# Patient Record
Sex: Male | Born: 1940 | Race: White | Hispanic: No | Marital: Married | Smoking: Former smoker
Health system: Southern US, Community
[De-identification: ages and names within clinical notes are randomized; demographics above are authoritative.]

## PROBLEM LIST (undated history)

## (undated) DIAGNOSIS — K5790 Diverticulosis of intestine, part unspecified, without perforation or abscess without bleeding: Secondary | ICD-10-CM

## (undated) DIAGNOSIS — Z9289 Personal history of other medical treatment: Secondary | ICD-10-CM

## (undated) DIAGNOSIS — K5900 Constipation, unspecified: Secondary | ICD-10-CM

## (undated) DIAGNOSIS — H409 Unspecified glaucoma: Secondary | ICD-10-CM

## (undated) DIAGNOSIS — J329 Chronic sinusitis, unspecified: Secondary | ICD-10-CM

## (undated) DIAGNOSIS — M720 Palmar fascial fibromatosis [Dupuytren]: Secondary | ICD-10-CM

## (undated) DIAGNOSIS — K219 Gastro-esophageal reflux disease without esophagitis: Secondary | ICD-10-CM

## (undated) DIAGNOSIS — N400 Enlarged prostate without lower urinary tract symptoms: Secondary | ICD-10-CM

## (undated) DIAGNOSIS — J310 Chronic rhinitis: Secondary | ICD-10-CM

## (undated) DIAGNOSIS — I10 Essential (primary) hypertension: Secondary | ICD-10-CM

## (undated) DIAGNOSIS — K649 Unspecified hemorrhoids: Secondary | ICD-10-CM

## (undated) DIAGNOSIS — F419 Anxiety disorder, unspecified: Secondary | ICD-10-CM

## (undated) DIAGNOSIS — Z8719 Personal history of other diseases of the digestive system: Secondary | ICD-10-CM

## (undated) HISTORY — PX: COLONOSCOPY, ESOPHAGOGASTRODUODENOSCOPY (EGD) AND ESOPHAGEAL DILATION: SHX5781

## (undated) HISTORY — PX: TONSILLECTOMY: SUR1361

---

## 2003-08-23 ENCOUNTER — Ambulatory Visit (HOSPITAL_BASED_OUTPATIENT_CLINIC_OR_DEPARTMENT_OTHER): Admission: RE | Admit: 2003-08-23 | Discharge: 2003-08-23 | Payer: Self-pay | Admitting: Orthopedic Surgery

## 2003-08-23 ENCOUNTER — Ambulatory Visit (HOSPITAL_COMMUNITY): Admission: RE | Admit: 2003-08-23 | Discharge: 2003-08-23 | Payer: Self-pay | Admitting: Orthopedic Surgery

## 2011-09-14 ENCOUNTER — Emergency Department: Payer: Self-pay | Admitting: Internal Medicine

## 2011-09-14 LAB — CK TOTAL AND CKMB (NOT AT ARMC)
CK, Total: 92 U/L (ref 35–232)
CK-MB: 0.9 ng/mL (ref 0.5–3.6)

## 2011-09-14 LAB — COMPREHENSIVE METABOLIC PANEL
Albumin: 4.1 g/dL (ref 3.4–5.0)
Alkaline Phosphatase: 62 U/L (ref 50–136)
BUN: 14 mg/dL (ref 7–18)
Bilirubin,Total: 0.8 mg/dL (ref 0.2–1.0)
Chloride: 97 mmol/L — ABNORMAL LOW (ref 98–107)
Co2: 24 mmol/L (ref 21–32)
Creatinine: 0.78 mg/dL (ref 0.60–1.30)
EGFR (Non-African Amer.): >60
Glucose: 114 mg/dL — ABNORMAL HIGH (ref 65–99)
Osmolality: 266 (ref 275–301)
SGOT(AST): 32 U/L (ref 15–37)
Sodium: 132 mmol/L — ABNORMAL LOW (ref 136–145)
Total Protein: 7.5 g/dL (ref 6.4–8.2)

## 2011-09-14 LAB — CBC
Platelet: 185 10*3/uL (ref 150–440)
RBC: 4.62 10*6/uL (ref 4.40–5.90)

## 2011-10-02 ENCOUNTER — Ambulatory Visit: Payer: Self-pay | Admitting: Urology

## 2012-02-19 IMAGING — CT CT ABDOMEN AND PELVIS WITHOUT AND WITH CONTRAST
2 of 4 series · 13 of 32 positions shown, 18 images · non-contrast
Comparison: none

REASON FOR EXAM: hematuria
COMMENTS:

[Series 2: abd 3mm wo 3.0 i40f 3 · axial · 0.90mm/px · z∈[-351,+0]mm · 8 of 151 slices shown, 13 images]
[im 17/151  soft-tissue]
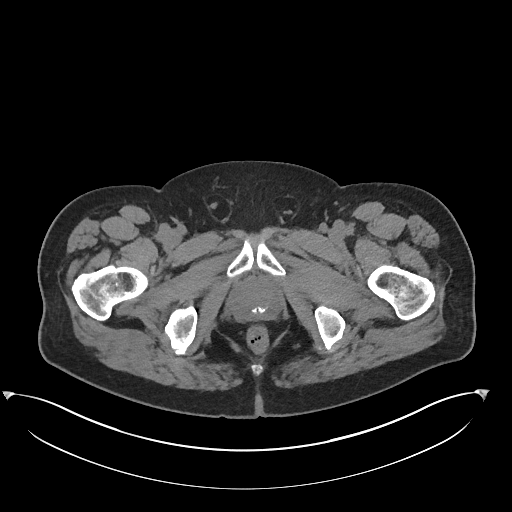
[im 17/151  bone]
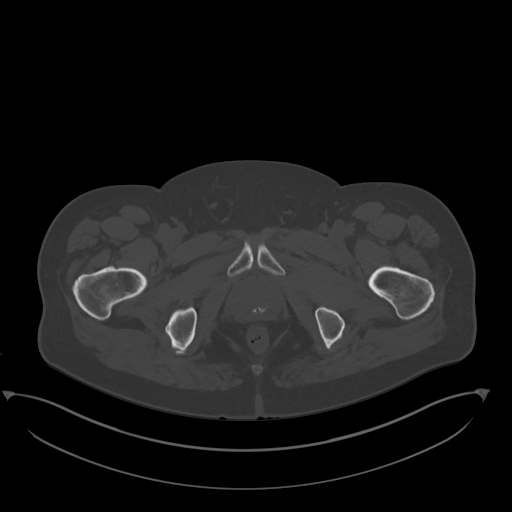
[im 34/151  soft-tissue]
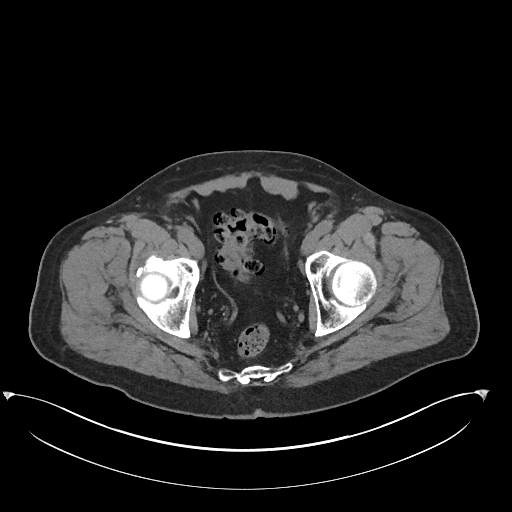
[im 51/151  soft-tissue]
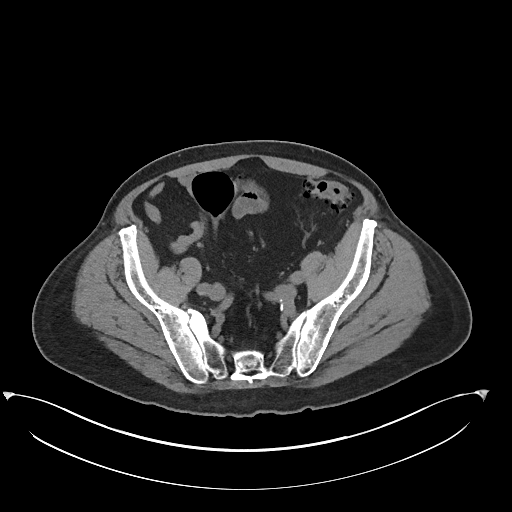
[im 67/151  soft-tissue]
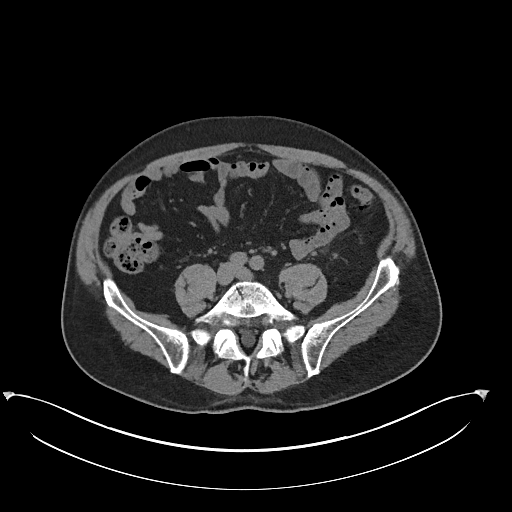
[im 84/151  soft-tissue]
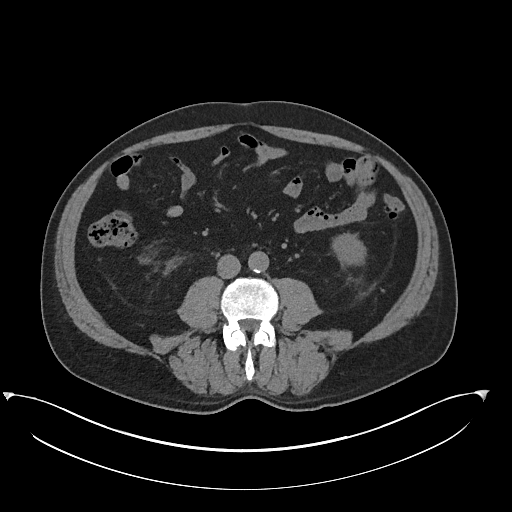
[im 84/151  lung]
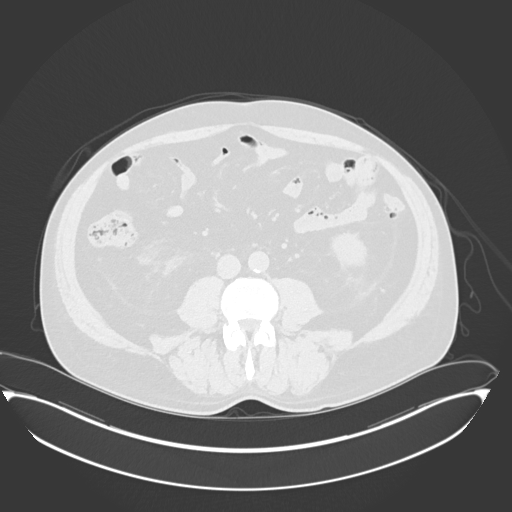
[im 101/151  soft-tissue]
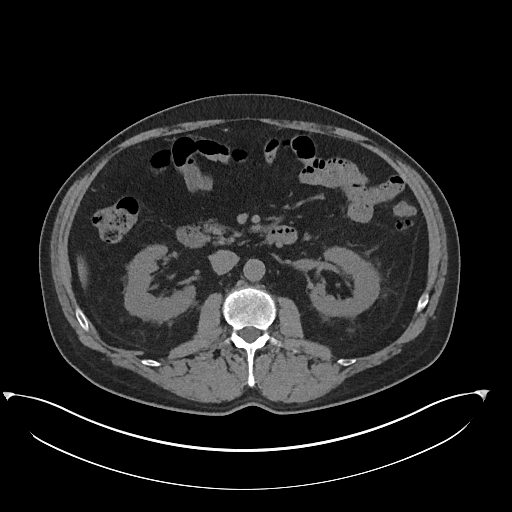
[im 101/151  lung]
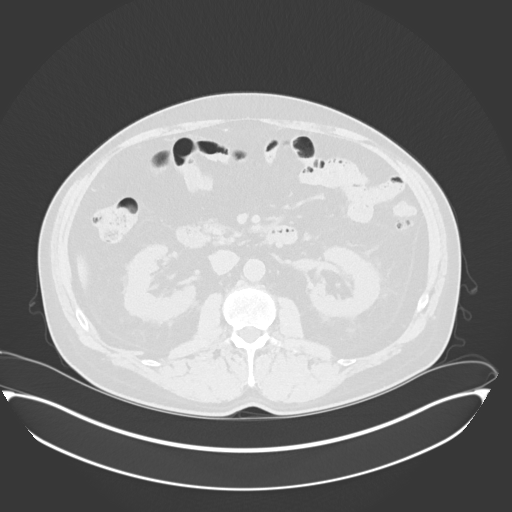
[im 117/151  soft-tissue]
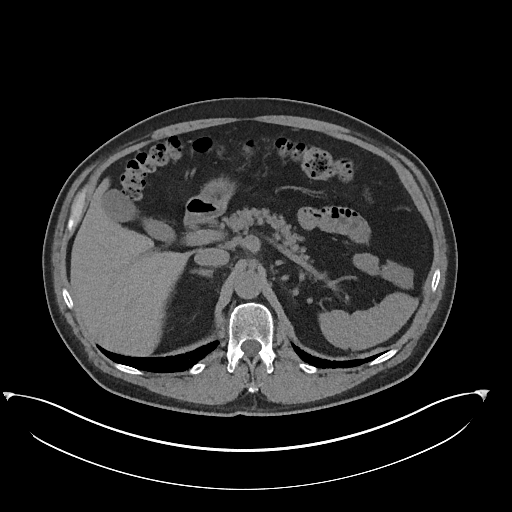
[im 117/151  lung]
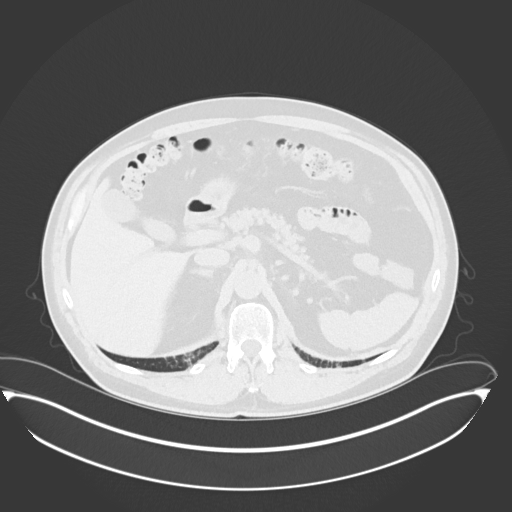
[im 134/151  soft-tissue]
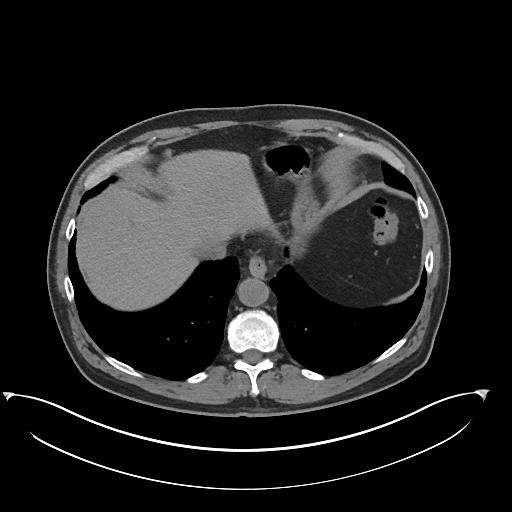
[im 134/151  lung]
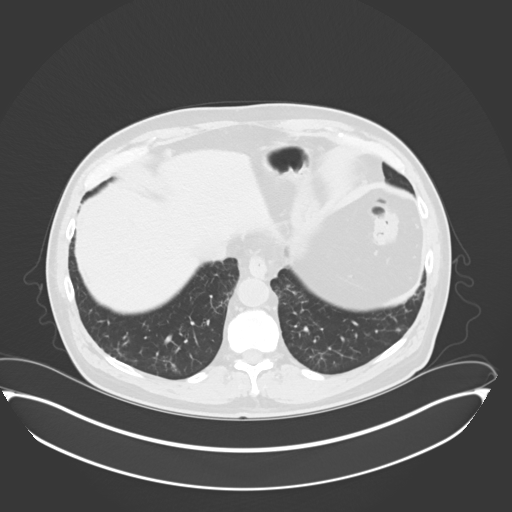

[Series 4: abd 3mm w 3.0 i40f 3 · axial · 0.90mm/px · z∈[-351,-150]mm · 5 of 151 slices shown]
[im 17/151  soft-tissue]
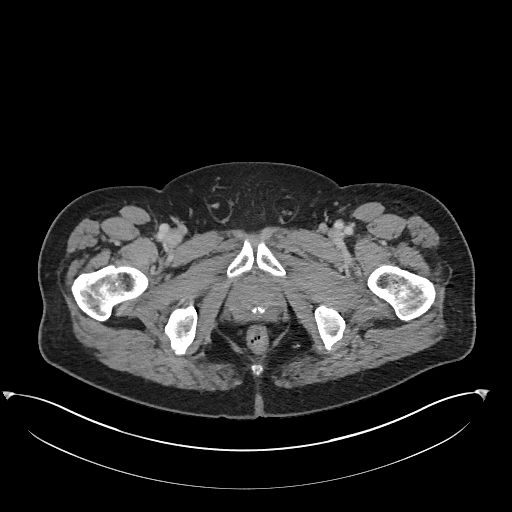
[im 34/151  soft-tissue]
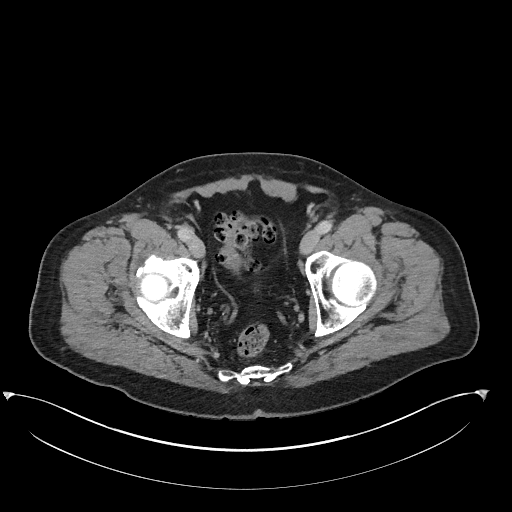
[im 51/151  soft-tissue]
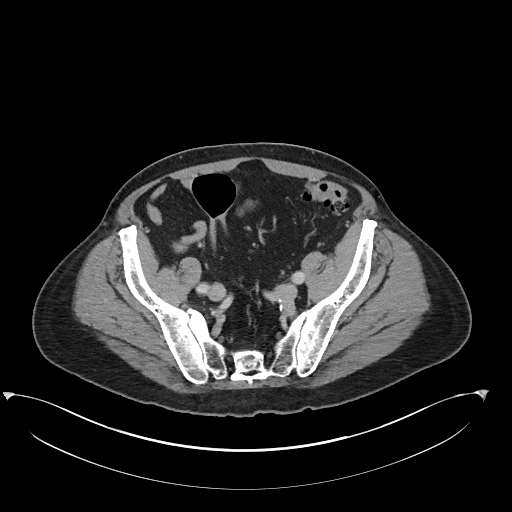
[im 67/151  soft-tissue]
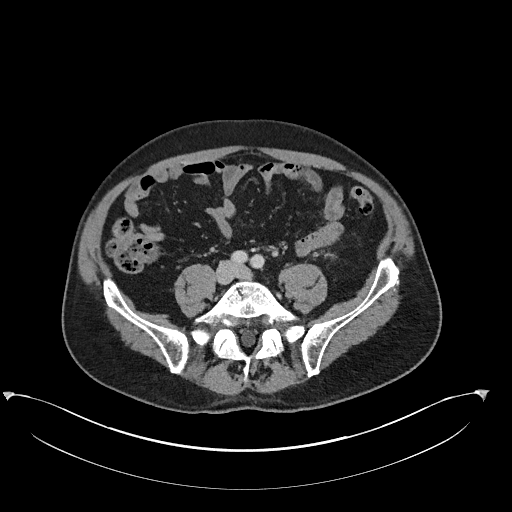
[im 84/151  soft-tissue]
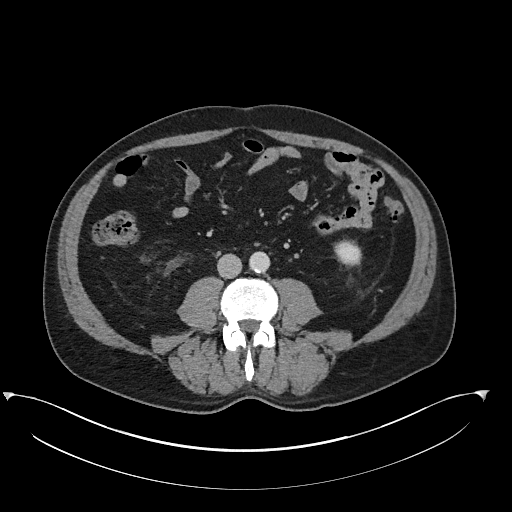

[13 of 32 positions shown; findings below may reference images not displayed]

PROCEDURE:     KCT - KCT ABDOMEN/PELVIS W/WO  - October 02, 2011 [DATE]

RESULT:     Axial CT scanning was performed through the abdomen and pelvis
with reconstructions at 3 millimeter intervals and slice thicknesses. A
triphasic technique was employed. For the postcontrast images the patient
received 100 cc of [id number].

On the noncontrast images there is increased density in the perinephric fat
bilaterally. I see no calcified stones nor obstruction nor mass is within
the parenchyma. Following contrast administration there is fairly
homogeneous enhancement of the renal parenchyma. On delayed images contrast
within the renal collecting systems appears normal. I see no renal masses.
The ureters appear normal in course and caliber. The urinary bladder is only
partially distended. There is subjective wall thickening. The prostate gland
and seminal vesicles appear normal in terms of their enhancement. The
prostate gland contains calcifications and is mildly enlarged and reduces a
mild impression upon the urinary bladder base.

There is significant sigmoid diverticulosis. I do not see objective evidence
of acute diverticulitis. There are scattered diverticula in the descending
colon is well. There is no evidence of small or large bowel obstruction. The
liver, gallbladder, pancreas, spleen, nondistended stomach, and adrenal
glands are normal in appearance. The caliber of the abdominal aorta is
normal. I see no inguinal nor umbilical hernia. The lung bases exhibit no
acute abnormality. The lumbar vertebral bodies are preserved in height. Is
degenerative disc change at L5-S1 with minimal anterolisthesis of L5 with
respect to S1. There are bilateral pars defects at L5.
IMPRESSION: 1. I do not see evidence of renal parenchymal masses nor evidence of stones
or obstruction. There is mildly increased density in the perinephric fat
bilaterally which is nonspecific. There are no enhancement abnormalities
within the renal parenchyma to suggest acute pyelonephritis.
2. There is mild enlargement of the prostate gland. The urinary bladder is
relatively nondistended and there is subjective wall thickening.
3. There is extensive sigmoid diverticulosis with milder descending colonic
diverticulosis. I do not see objective evidence of acute diverticulitis.
4. I see no acute hepatobiliary abnormality.

## 2014-01-31 ENCOUNTER — Ambulatory Visit: Payer: Self-pay

## 2014-02-22 ENCOUNTER — Ambulatory Visit: Payer: Self-pay | Admitting: Gastroenterology

## 2014-02-25 LAB — PATHOLOGY REPORT

## 2014-03-11 ENCOUNTER — Ambulatory Visit: Payer: Self-pay | Admitting: Gastroenterology

## 2014-06-20 IMAGING — US ULTRASOUND RETROPERITONEAL COMPLETE
1 series · 14 of 25 positions shown · non-contrast
Comparison: None.

CLINICAL DATA: Smoker, hypertension

EXAM:
RETROPERITONEAL ULTRASOUND COMPLETE
TECHNIQUE: Ultrasound examination of the abdominal aorta was performed to
evaluate for abdominal aortic aneurysm. The common iliac arteries,
IVC, and kidneys were also evaluated.

[Series 1: ultrasound retroperitoneal complete · 0.36mm/px · 14 of 45 slices shown]
[im 1/45]
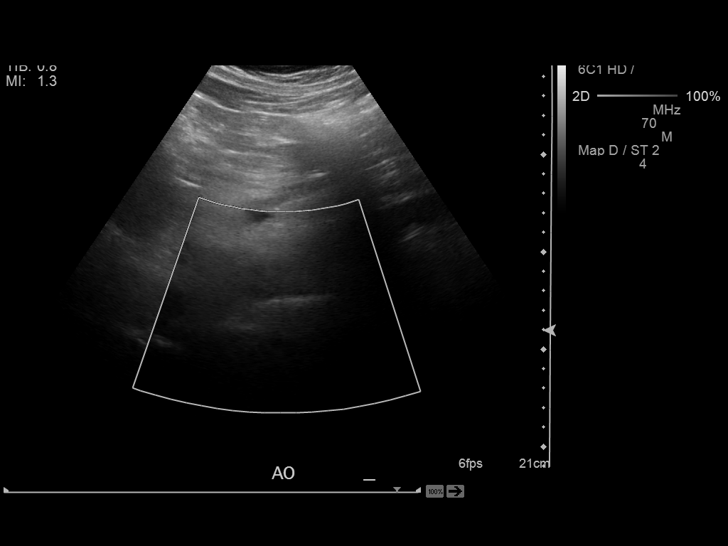
[im 4/45]
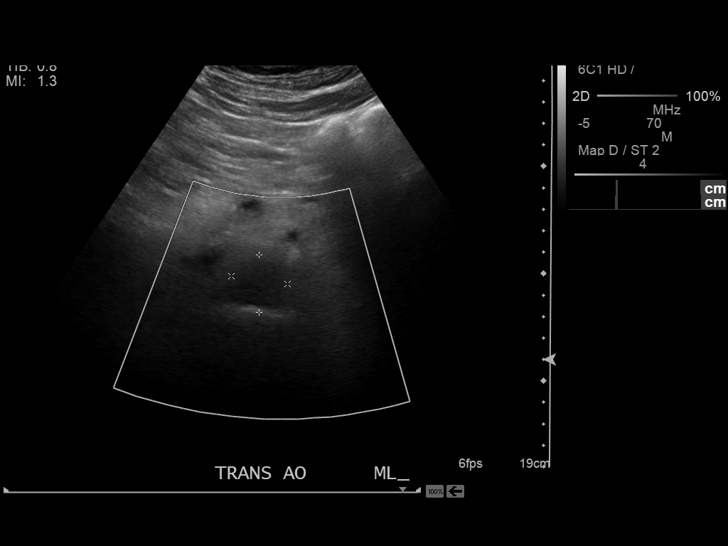
[im 8/45]
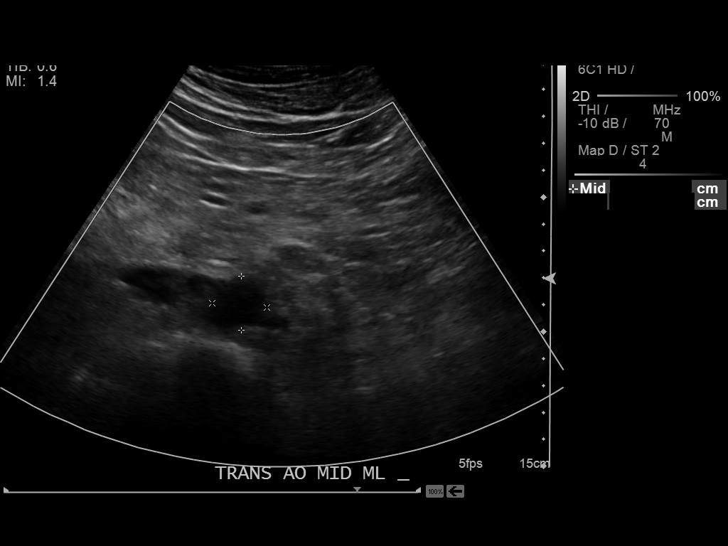
[im 12/45]
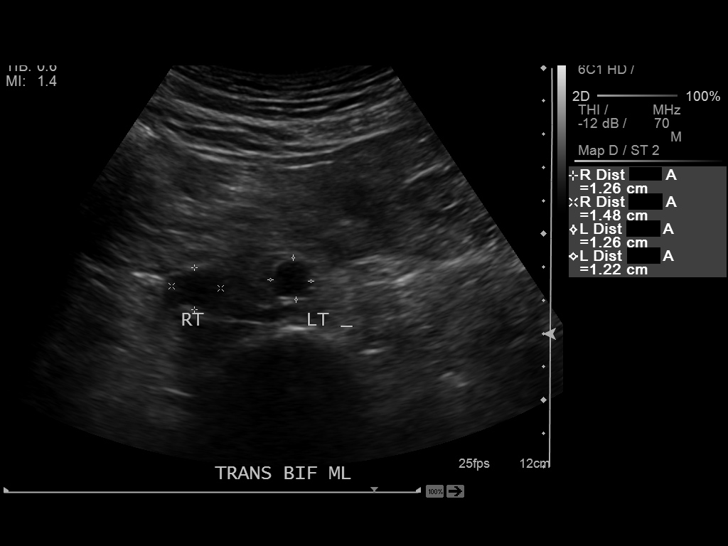
[im 15/45]
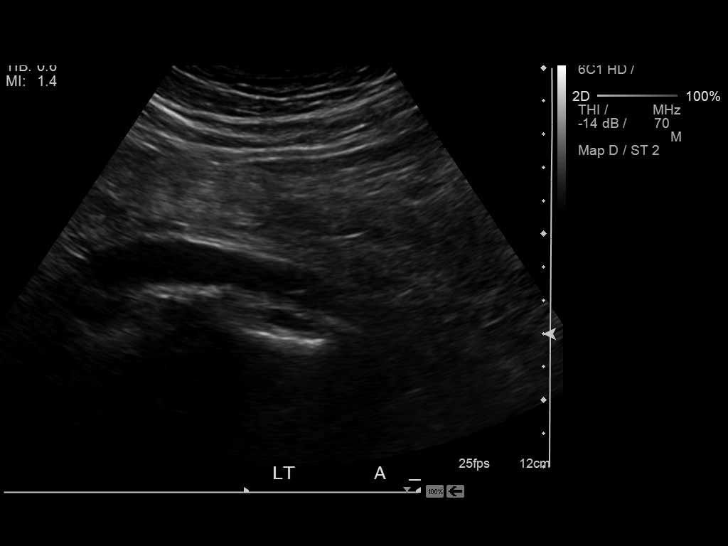
[im 17/45]
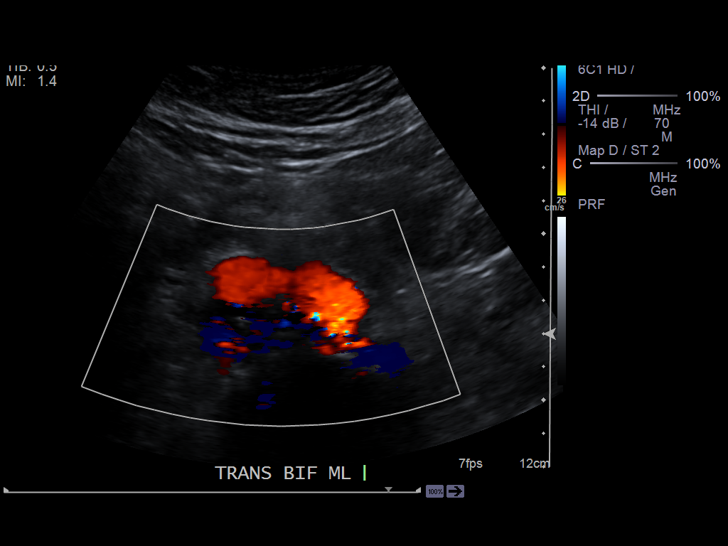
[im 21/45]
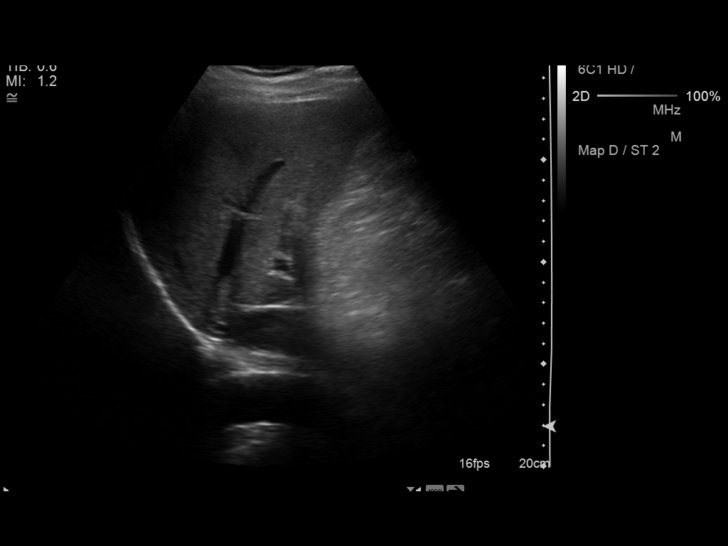
[im 24/45]
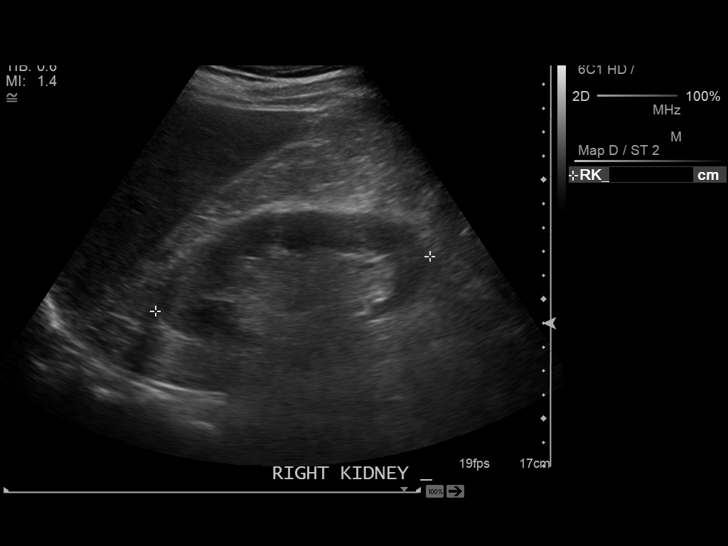
[im 28/45]
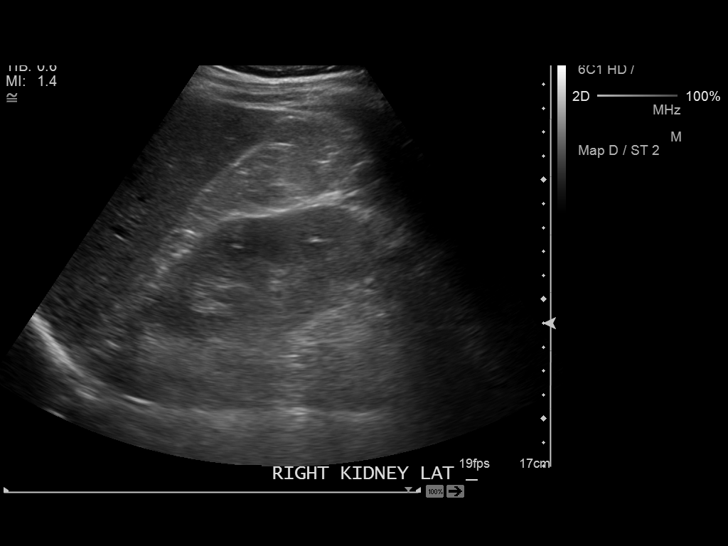
[im 30/45]
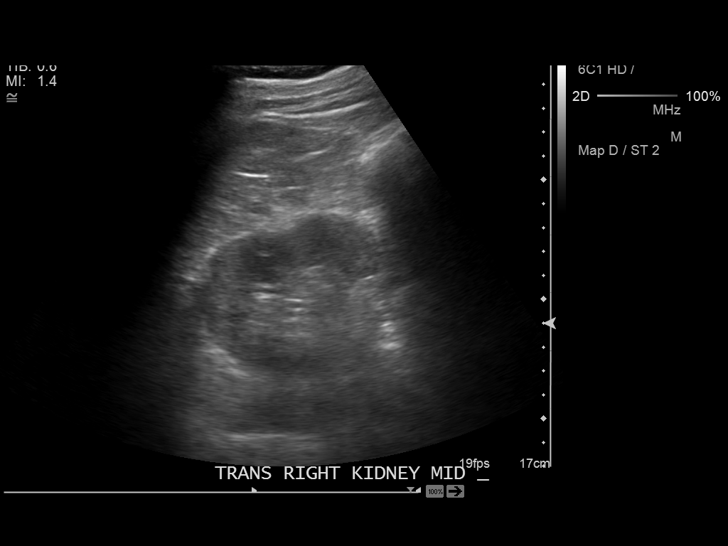
[im 34/45]
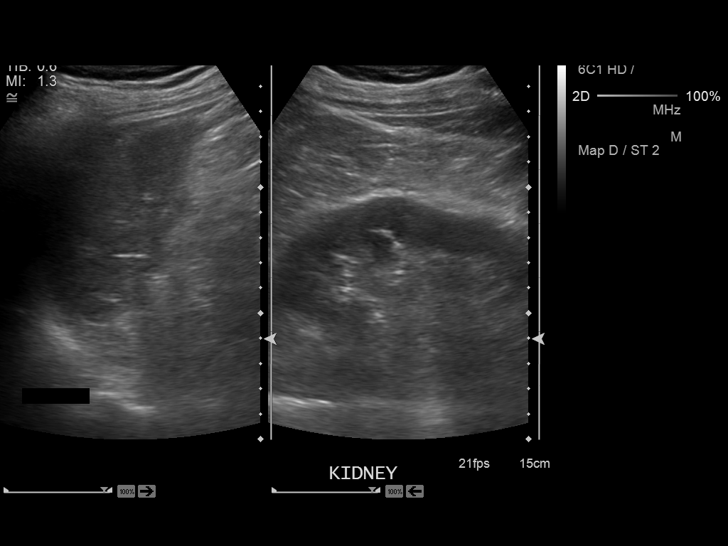
[im 37/45]
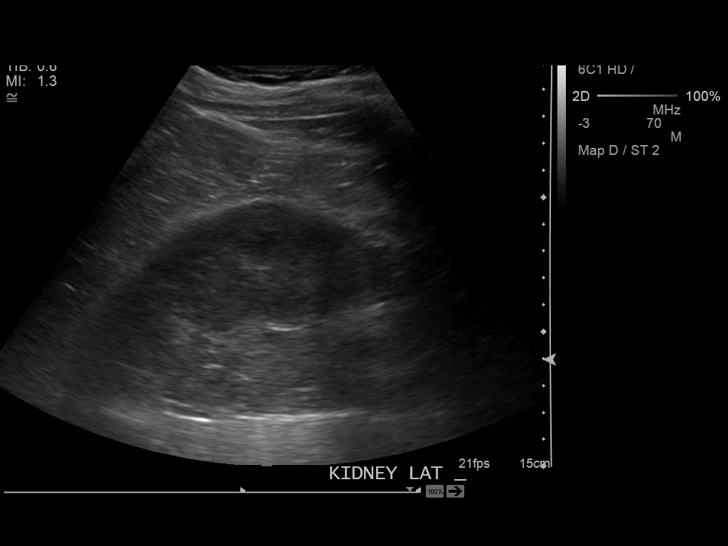
[im 41/45]
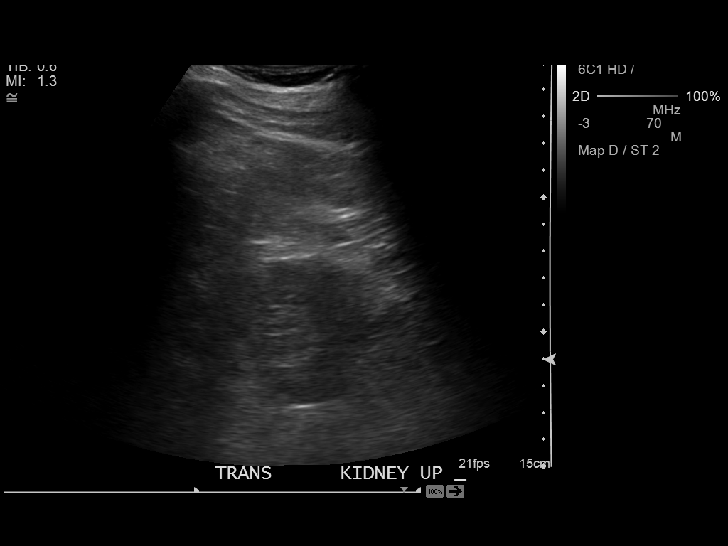
[im 45/45]
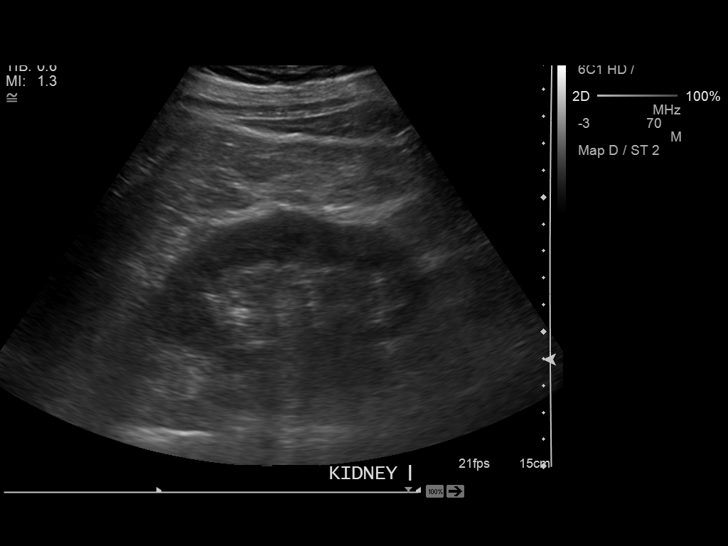

[14 of 25 positions shown; findings below may reference images not displayed]

FINDINGS: Abdominal Aorta

No aneurysm identified.

Maximum AP

Diameter:  2.8 cm

Maximum TRV

Diameter: 2.7 cm

Right Common Iliac Artery

Measures 1.5 cm in diameter.

Left Common Iliac Artery

Measures 1.3 cm in diameter.

IVC

No abnormality visualized.

Right Kidney

Length: 11.7 cm length Echogenicity within normal limits. No mass or
hydronephrosis visualized.

Left Kidney

Length: 11.7 cm Echogenicity within normal limits. No mass or
hydronephrosis visualized.
IMPRESSION: 1. No abdominal aortic aneurysm. Aorta measures 2.8 x 2.7 cm in
diameter. Ectatic bilateral common iliac artery.
2. No hydronephrosis or diagnostic renal calculus.

## 2015-04-12 ENCOUNTER — Encounter: Payer: Self-pay | Admitting: *Deleted

## 2015-04-13 ENCOUNTER — Ambulatory Visit
Admission: RE | Admit: 2015-04-13 | Discharge: 2015-04-13 | Disposition: A | Discharge status: Home / Self Care | Payer: Medicare Other | Source: Ambulatory Visit | Attending: Gastroenterology | Admitting: Gastroenterology

## 2015-04-13 ENCOUNTER — Encounter
Admission: RE | Disposition: A | Discharge status: Home / Self Care | Payer: Self-pay | Source: Ambulatory Visit | Attending: Gastroenterology

## 2015-04-13 ENCOUNTER — Encounter: Payer: Self-pay | Admitting: Anesthesiology

## 2015-04-13 ENCOUNTER — Ambulatory Visit: Payer: Medicare Other | Admitting: Anesthesiology

## 2015-04-13 DIAGNOSIS — Z79899 Other long term (current) drug therapy: Secondary | ICD-10-CM | POA: Diagnosis not present

## 2015-04-13 DIAGNOSIS — K2900 Acute gastritis without bleeding: Secondary | ICD-10-CM | POA: Diagnosis not present

## 2015-04-13 DIAGNOSIS — I10 Essential (primary) hypertension: Secondary | ICD-10-CM | POA: Insufficient documentation

## 2015-04-13 DIAGNOSIS — H409 Unspecified glaucoma: Secondary | ICD-10-CM | POA: Diagnosis not present

## 2015-04-13 DIAGNOSIS — K5790 Diverticulosis of intestine, part unspecified, without perforation or abscess without bleeding: Secondary | ICD-10-CM | POA: Diagnosis not present

## 2015-04-13 DIAGNOSIS — K449 Diaphragmatic hernia without obstruction or gangrene: Secondary | ICD-10-CM | POA: Diagnosis not present

## 2015-04-13 DIAGNOSIS — N400 Enlarged prostate without lower urinary tract symptoms: Secondary | ICD-10-CM | POA: Diagnosis not present

## 2015-04-13 DIAGNOSIS — K210 Gastro-esophageal reflux disease with esophagitis: Secondary | ICD-10-CM | POA: Insufficient documentation

## 2015-04-13 DIAGNOSIS — K5900 Constipation, unspecified: Secondary | ICD-10-CM | POA: Diagnosis not present

## 2015-04-13 DIAGNOSIS — Z7982 Long term (current) use of aspirin: Secondary | ICD-10-CM | POA: Insufficient documentation

## 2015-04-13 DIAGNOSIS — M720 Palmar fascial fibromatosis [Dupuytren]: Secondary | ICD-10-CM | POA: Insufficient documentation

## 2015-04-13 DIAGNOSIS — F419 Anxiety disorder, unspecified: Secondary | ICD-10-CM | POA: Insufficient documentation

## 2015-04-13 DIAGNOSIS — K2960 Other gastritis without bleeding: Secondary | ICD-10-CM | POA: Diagnosis present

## 2015-04-13 DIAGNOSIS — Z8719 Personal history of other diseases of the digestive system: Secondary | ICD-10-CM | POA: Diagnosis not present

## 2015-04-13 HISTORY — PX: ESOPHAGOGASTRODUODENOSCOPY (EGD) WITH PROPOFOL: SHX5813

## 2015-04-13 HISTORY — DX: Unspecified hemorrhoids: K64.9

## 2015-04-13 HISTORY — DX: Gastro-esophageal reflux disease without esophagitis: K21.9

## 2015-04-13 HISTORY — DX: Unspecified glaucoma: H40.9

## 2015-04-13 HISTORY — DX: Benign prostatic hyperplasia without lower urinary tract symptoms: N40.0

## 2015-04-13 HISTORY — DX: Essential (primary) hypertension: I10

## 2015-04-13 HISTORY — DX: Diverticulosis of intestine, part unspecified, without perforation or abscess without bleeding: K57.90

## 2015-04-13 HISTORY — DX: Constipation, unspecified: K59.00

## 2015-04-13 HISTORY — DX: Palmar fascial fibromatosis (dupuytren): M72.0

## 2015-04-13 HISTORY — DX: Anxiety disorder, unspecified: F41.9

## 2015-04-13 SURGERY — ESOPHAGOGASTRODUODENOSCOPY (EGD) WITH PROPOFOL
Anesthesia: General

## 2015-04-13 MED ORDER — GLYCOPYRROLATE 0.2 MG/ML IJ SOLN
INTRAMUSCULAR | Status: DC | PRN
  Administered 2015-04-13: 0.1 mg via INTRAVENOUS

## 2015-04-13 MED ORDER — LIDOCAINE HCL (CARDIAC) 20 MG/ML IV SOLN
INTRAVENOUS | Status: DC | PRN
  Administered 2015-04-13: 30 mg via INTRAVENOUS

## 2015-04-13 MED ORDER — SODIUM CHLORIDE 0.9 % IV SOLN: INTRAVENOUS | Status: DC

## 2015-04-13 MED ORDER — SODIUM CHLORIDE 0.9 % IV SOLN
INTRAVENOUS | Status: DC
  Administered 2015-04-13: 09:00:00 via INTRAVENOUS

## 2015-04-13 MED ORDER — PROPOFOL INFUSION 10 MG/ML OPTIME
INTRAVENOUS | Status: DC | PRN
  Administered 2015-04-13: 140 ug/kg/min via INTRAVENOUS

## 2015-04-13 MED ORDER — FENTANYL CITRATE (PF) 100 MCG/2ML IJ SOLN
INTRAMUSCULAR | Status: DC | PRN
  Administered 2015-04-13: 50 ug via INTRAVENOUS

## 2015-04-13 MED ORDER — MIDAZOLAM HCL 2 MG/2ML IJ SOLN
INTRAMUSCULAR | Status: DC | PRN
  Administered 2015-04-13: 1 mg via INTRAVENOUS

## 2015-04-13 NOTE — Anesthesia Postprocedure Evaluation (Signed)
  Anesthesia Post-op Note  Patient: Dale Holloway  Procedure(s) Performed: Procedure(s): ESOPHAGOGASTRODUODENOSCOPY (EGD) WITH PROPOFOL (N/A)  Anesthesia type:General  Patient location: PACU  Post pain: Pain level controlled  Post assessment: Post-op Vital signs reviewed, Patient's Cardiovascular Status Stable, Respiratory Function Stable, Patent Airway and No signs of Nausea or vomiting  Post vital signs: Reviewed and stable  Last Vitals:  Filed Vitals:   04/13/15 1000  BP: 144/97  Pulse: 58  Temp:   Resp: 10    Level of consciousness: awake, alert  and patient cooperative  Complications: No apparent anesthesia complications

## 2015-04-13 NOTE — Transfer of Care (Signed)
Immediate Anesthesia Transfer of Care Note  Patient: Dale Holloway  Procedure(s) Performed: Procedure(s): ESOPHAGOGASTRODUODENOSCOPY (EGD) WITH PROPOFOL (N/A)  Patient Location: PACU  Anesthesia Type:General  Level of Consciousness: awake and sedated  Airway & Oxygen Therapy: Patient Spontanous Breathing and Patient connected to nasal cannula oxygen  Post-op Assessment: Report given to RN and Post -op Vital signs reviewed and stable  Post vital signs: Reviewed and stable  Last Vitals:  Filed Vitals:   04/13/15 0824  BP: 166/96  Pulse: 65  Temp: 36.8 C  Resp: 12    Complications: No apparent anesthesia complications

## 2015-04-13 NOTE — Anesthesia Procedure Notes (Signed)
Performed by: COOK-MARTIN, Annie Roseboom Pre-anesthesia Checklist: Patient identified, Emergency Drugs available, Suction available, Patient being monitored and Timeout performed Patient Re-evaluated:Patient Re-evaluated prior to inductionOxygen Delivery Method: Nasal cannula Preoxygenation: Pre-oxygenation with 100% oxygen Intubation Type: IV induction Airway Equipment and Method: Bite block Placement Confirmation: CO2 detector and positive ETCO2     

## 2015-04-13 NOTE — Op Note (Signed)
[facility] Gastroenterology Patient Name: Dale Holloway Procedure Date: 04/13/2015 8:56 AM MRN: [id number] Account #: [id number] Date of Birth: 1941/02/13 Admit Type: Outpatient Age: 74 Room: [facility] ENDO ROOM 3 Gender: Male Note Status: Finalized Procedure:         Upper GI endoscopy Indications:       Follow-up of Barrett's esophagus Providers:         Christena Deem, MD Referring MD:      Teena Irani. Sampson Goon, MD (Referring MD) Medicines:         Monitored Anesthesia Care Complications:     No immediate complications. Procedure:         Pre-Anesthesia Assessment:                    - ASA Grade Assessment: III - A patient with severe                     systemic disease.                    After obtaining informed consent, the endoscope was passed                     under direct vision. Throughout the procedure, the                     patient's blood pressure, pulse, and oxygen saturations                     were monitored continuously. The Endoscope was introduced                     through the mouth, and advanced to the third part of                     duodenum. The upper GI endoscopy was accomplished without                     difficulty. The patient tolerated the procedure well. Findings:      There were esophageal mucosal changes suggestive of short-segment       Barrett's esophagus present at the gastroesophageal junction. The       maximum longitudinal extent of these mucosal changes was 1 cm in length.       Mucosa was biopsied with a cold forceps for histology in 4 quadrants.       One specimen bottle was sent to pathology.      Abnormal motility was noted in the lower third of the esophagus and at       the gastroesophageal junction. Prominant cricopharyngeus. There are       extra peristaltic waves of the esophageal body. Tertiary peristaltic       waves are noted.      Patchy mild inflammation characterized by erosions and erythema was   found in the gastric antrum. Biopsies were taken with a cold forceps for       histology.      A small hiatus hernia was found. The Z-line was a variable distance from       incisors; the hiatal hernia was sliding.      The cardia and gastric fundus were normal on retroflexion otherwise.      The examined duodenum was normal. Impression:        - Esophageal mucosal changes suggestive of short-segment  Barrett's esophagus. Biopsied.                    - The examination was suspicious for esophageal spasm.                    - Erosive gastritis. Biopsied.                    - Small hiatus hernia.                    - Normal examined duodenum. Recommendation:    - Continue present medications.                    - Await pathology results.                    - Telephone GI clinic for pathology results in 1 week. Procedure Code(s): --- Professional ---                    [phone], Esophagogastroduodenoscopy, flexible, transoral;                     with biopsy, single or multiple Diagnosis Code(s): --- Professional ---                    530.85, Barrett's esophagus                    535.40, Other specified gastritis, without mention of                     hemorrhage                    553.3, Diaphragmatic hernia without mention of obstruction                     or gangrene CPT copyright 2014 American [employer]. All rights reserved. The codes documented in this report are preliminary and upon coder review may  be revised to meet current compliance requirements. Christena Deem, MD 04/13/2015 9:22:47 AM This report has been signed electronically. Number of Addenda: 0 Note Initiated On: 04/13/2015 8:56 AM      [facility]

## 2015-04-13 NOTE — Anesthesia Preprocedure Evaluation (Addendum)
Anesthesia Evaluation  Patient identified by MRN, date of birth, ID band Patient awake    Reviewed: Allergy & Precautions, NPO status , Patient's Chart, lab work & pertinent test results, reviewed documented beta blocker date and time   Airway Mallampati: II  TM Distance: >3 FB     Dental  (+) Chipped   Pulmonary          Cardiovascular hypertension,     Neuro/Psych Anxiety    GI/Hepatic GERD-  Medicated,  Endo/Other    Renal/GU      Musculoskeletal   Abdominal   Peds  Hematology   Anesthesia Other Findings   Reproductive/Obstetrics                            Anesthesia Physical Anesthesia Plan  ASA: III  Anesthesia Plan: General   Post-op Pain Management:    Induction: Intravenous  Airway Management Planned: Nasal Cannula  Additional Equipment:   Intra-op Plan:   Post-operative Plan:   Informed Consent: I have reviewed the patients History and Physical, chart, labs and discussed the procedure including the risks, benefits and alternatives for the proposed anesthesia with the patient or authorized representative who has indicated his/her understanding and acceptance.     Plan Discussed with: CRNA  Anesthesia Plan Comments:        Anesthesia Quick Evaluation

## 2015-04-13 NOTE — H&P (Signed)
Outpatient short stay form Pre-procedure 04/13/2015 8:51 AM Dale Deem MD  Primary Physician: Dr. Clydie Braun  Reason for visit:  EGD  History of present illness:  Patient is a 74 year old male presenting today for EGD in regards to his personal history of Barrett's esophagus. He has been taking proton pump inhibitor has occasional heartburn over no dysphagia.    Current facility-administered medications:  .  0.9 %  sodium chloride infusion, , Intravenous, Continuous, Dale Deem, MD, Last Rate: 20 mL/hr at 04/13/15 0840 .  0.9 %  sodium chloride infusion, , Intravenous, Continuous, Dale Deem, MD  Prescriptions prior to admission  Medication Sig Dispense Refill Last Dose  . aspirin EC 81 MG tablet Take 81 mg by mouth daily.   Past Week at Unknown time  . dorzolamide-timolol (COSOPT) 22.3-6.8 MG/ML ophthalmic solution Place 1 drop into both eyes 2 (two) times daily.   04/13/2015 at Unknown time  . latanoprost (XALATAN) 0.005 % ophthalmic solution 1 drop at bedtime.   04/12/2015 at Unknown time  . pantoprazole (PROTONIX) 40 MG tablet Take 40 mg by mouth daily.   04/12/2015 at Unknown time  . valsartan (DIOVAN) 160 MG tablet Take 160 mg by mouth daily.   04/13/2015 at Unknown time  . zolpidem (AMBIEN) 5 MG tablet Take 5 mg by mouth at bedtime as needed for sleep.   04/12/2015 at Unknown time     No Known Allergies   Past Medical History  Diagnosis Date  . Hypertension   . Anxiety   . BPH (benign prostatic hyperplasia)   . Constipation   . Glaucoma   . GERD (gastroesophageal reflux disease)   . Hemorrhoids   . Diverticulosis   . Dupuytren's contracture     Review of systems:      Physical Exam    Heart and lungs: Regular rate and rhythm without rub or gallop lungs are bilaterally clear    HEENT: Normocephalic atraumatic eyes are anicteric    Other:     Pertinant exam for procedure: Soft nontender nondistended bowel sounds positive  normoactive    Planned proceedures: EGD and indicated procedures I have discussed the risks benefits and complications of procedures to include not limited to bleeding, infection, perforation and the risk of sedation and the patient wishes to proceed.    Dale Deem, MD Gastroenterology 04/13/2015  8:51 AM

## 2015-04-14 ENCOUNTER — Encounter: Payer: Self-pay | Admitting: Gastroenterology

## 2015-04-14 LAB — SURGICAL PATHOLOGY

## 2017-09-22 ENCOUNTER — Encounter: Payer: Self-pay | Admitting: *Deleted

## 2017-09-23 ENCOUNTER — Ambulatory Visit: Payer: Medicare Other | Admitting: Anesthesiology

## 2017-09-23 ENCOUNTER — Encounter: Payer: Self-pay | Admitting: *Deleted

## 2017-09-23 ENCOUNTER — Encounter
Admission: RE | Disposition: A | Discharge status: Home / Self Care | Payer: Self-pay | Source: Ambulatory Visit | Attending: Gastroenterology

## 2017-09-23 ENCOUNTER — Ambulatory Visit
Admission: RE | Admit: 2017-09-23 | Discharge: 2017-09-23 | Disposition: A | Discharge status: Home / Self Care | Payer: Medicare Other | Source: Ambulatory Visit | Attending: Gastroenterology | Admitting: Gastroenterology

## 2017-09-23 DIAGNOSIS — K2270 Barrett's esophagus without dysplasia: Secondary | ICD-10-CM | POA: Insufficient documentation

## 2017-09-23 DIAGNOSIS — I10 Essential (primary) hypertension: Secondary | ICD-10-CM | POA: Insufficient documentation

## 2017-09-23 DIAGNOSIS — K449 Diaphragmatic hernia without obstruction or gangrene: Secondary | ICD-10-CM | POA: Diagnosis not present

## 2017-09-23 DIAGNOSIS — N400 Enlarged prostate without lower urinary tract symptoms: Secondary | ICD-10-CM | POA: Diagnosis not present

## 2017-09-23 DIAGNOSIS — K222 Esophageal obstruction: Secondary | ICD-10-CM | POA: Insufficient documentation

## 2017-09-23 DIAGNOSIS — K219 Gastro-esophageal reflux disease without esophagitis: Secondary | ICD-10-CM | POA: Insufficient documentation

## 2017-09-23 DIAGNOSIS — F419 Anxiety disorder, unspecified: Secondary | ICD-10-CM | POA: Diagnosis not present

## 2017-09-23 DIAGNOSIS — K2950 Unspecified chronic gastritis without bleeding: Secondary | ICD-10-CM | POA: Diagnosis not present

## 2017-09-23 DIAGNOSIS — Z87891 Personal history of nicotine dependence: Secondary | ICD-10-CM | POA: Insufficient documentation

## 2017-09-23 DIAGNOSIS — Z79899 Other long term (current) drug therapy: Secondary | ICD-10-CM | POA: Insufficient documentation

## 2017-09-23 HISTORY — DX: Personal history of other medical treatment: Z92.89

## 2017-09-23 HISTORY — DX: Chronic rhinitis: J31.0

## 2017-09-23 HISTORY — DX: Chronic sinusitis, unspecified: J32.9

## 2017-09-23 HISTORY — PX: ESOPHAGOGASTRODUODENOSCOPY (EGD) WITH PROPOFOL: SHX5813

## 2017-09-23 HISTORY — DX: Personal history of other diseases of the digestive system: Z87.19

## 2017-09-23 SURGERY — ESOPHAGOGASTRODUODENOSCOPY (EGD) WITH PROPOFOL
Anesthesia: General

## 2017-09-23 MED ORDER — LIDOCAINE HCL (PF) 2 % IJ SOLN
INTRAMUSCULAR | Status: AC
  Filled 2017-09-23: qty 10

## 2017-09-23 MED ORDER — LIDOCAINE HCL (PF) 1 % IJ SOLN
INTRAMUSCULAR | Status: AC
  Administered 2017-09-23: 0.3 mL via INTRADERMAL
  Filled 2017-09-23: qty 2

## 2017-09-23 MED ORDER — SODIUM CHLORIDE 0.9 % IV SOLN: INTRAVENOUS | Status: DC

## 2017-09-23 MED ORDER — PROPOFOL 500 MG/50ML IV EMUL
INTRAVENOUS | Status: AC
  Filled 2017-09-23: qty 50

## 2017-09-23 MED ORDER — PROPOFOL 500 MG/50ML IV EMUL
INTRAVENOUS | Status: DC | PRN
  Administered 2017-09-23: 140 ug/kg/min via INTRAVENOUS

## 2017-09-23 MED ORDER — LIDOCAINE HCL (PF) 1 % IJ SOLN
2.0000 mL | Freq: Once | INTRAMUSCULAR | Status: AC
  Administered 2017-09-23: 0.3 mL via INTRADERMAL

## 2017-09-23 MED ORDER — SODIUM CHLORIDE 0.9 % IV SOLN
INTRAVENOUS | Status: DC
  Administered 2017-09-23: 1000 mL via INTRAVENOUS

## 2017-09-23 MED ORDER — LIDOCAINE HCL (CARDIAC) 20 MG/ML IV SOLN
INTRAVENOUS | Status: DC | PRN
  Administered 2017-09-23: 50 mg via INTRAVENOUS

## 2017-09-23 MED ORDER — PROPOFOL 10 MG/ML IV BOLUS
INTRAVENOUS | Status: DC | PRN
  Administered 2017-09-23: 90 mg via INTRAVENOUS
  Administered 2017-09-23: 20 mg via INTRAVENOUS
  Administered 2017-09-23: 30 mg via INTRAVENOUS

## 2017-09-23 NOTE — H&P (Signed)
Outpatient short stay form Pre-procedure 09/23/2017 9:39 AM Dale DeemMartin Holloway [name] MD  Primary Physician: Dr. Clydie Braunavid Fitzgerald  Reason for visit: EGD.   History of present illness:  Patient is a 77 year old male presenting today as above.  He has personal history of Barrett's esophagus without dysplasia.  He currently has no problems with heartburn or difficulty swallowing.  His last EGD was 04/13/2015.  He will intermittently take 81 mg aspirin but has not for some time.  He takes no other aspirin products or blood thinning agent.     Current Facility-Administered Medications:  .  0.9 %  sodium chloride infusion, , Intravenous, Continuous, Dale DeemSkulskie, Shahid Flori U, MD, Last Rate: 20 mL/hr at 09/23/17 0859, 1,000 mL at 09/23/17 0859 .  0.9 %  sodium chloride infusion, , Intravenous, Continuous, Dale DeemSkulskie, Vint Pola U, MD  Medications Prior to Admission  Medication Sig Dispense Refill Last Dose  . aspirin EC 81 MG tablet Take 81 mg by mouth daily.   Past Week at Unknown time  . dorzolamide-timolol (COSOPT) 22.3-6.8 MG/ML ophthalmic solution Place 1 drop into both eyes 2 (two) times daily.   04/13/2015 at Unknown time  . latanoprost (XALATAN) 0.005 % ophthalmic solution 1 drop at bedtime.   04/12/2015 at Unknown time  . pantoprazole (PROTONIX) 40 MG tablet Take 40 mg by mouth daily.   04/12/2015 at Unknown time  . valsartan (DIOVAN) 160 MG tablet Take 160 mg by mouth daily.   09/23/2017 at 0600  . zolpidem (AMBIEN) 5 MG tablet Take 5 mg by mouth at bedtime as needed for sleep.   04/12/2015 at Unknown time     No Known Allergies   Past Medical History:  Diagnosis Date  . Anxiety   . BPH (benign prostatic hyperplasia)   . Constipation   . Diverticulosis   . Dupuytren's contracture   . GERD (gastroesophageal reflux disease)    Barrett's Esophagus  . Glaucoma   . Hemorrhoids   . History of hiatal hernia   . History of stress test    Reported as Negative for Angina Pectoris  . Hypertension   . Rhinitis    . Sinusitis     Review of systems:      Physical Exam    Heart and lungs: Regular rate and rhythm without rub or gallop, lungs are bilaterally clear.    HEENT: Normocephalic atraumatic eyes are anicteric    Other:    Pertinant exam for procedure: Soft nontender nondistended bowel sounds positive normoactive.    Planned proceedures: EGD and indicated procedures. I have discussed the risks benefits and complications of procedures to include not limited to bleeding, infection, perforation and the risk of sedation and the patient wishes to proceed.    Dale DeemMartin Holloway Emmylou Bieker, MD Gastroenterology 09/23/2017  9:39 AM

## 2017-09-23 NOTE — Anesthesia Postprocedure Evaluation (Signed)
Anesthesia Post Note  Patient: Dale Holloway  Procedure(s) Performed: ESOPHAGOGASTRODUODENOSCOPY (EGD) WITH PROPOFOL (N/A )  Patient location during evaluation: Endoscopy Anesthesia Type: General Level of consciousness: awake and alert and oriented Pain management: pain level controlled Vital Signs Assessment: post-procedure vital signs reviewed and stable Respiratory status: spontaneous breathing, nonlabored ventilation and respiratory function stable Cardiovascular status: blood pressure returned to baseline and stable Postop Assessment: no signs of nausea or vomiting Anesthetic complications: no     Last Vitals:  Vitals:   09/23/17 1010 09/23/17 1020  BP: (!) 127/98 (!) 145/99  Pulse: 71 71  Resp: 15 16  Temp:    SpO2: 98% 99%    Last Pain: There were no vitals filed for this visit.               Jream Broyles

## 2017-09-23 NOTE — Op Note (Signed)
[facility] Gastroenterology Patient Name: Dale Holloway Procedure Date: 09/23/2017 9:41 AM MRN: [id number] Account #: [id number] Date of Birth: Dec 17, 1940 Admit Type: Outpatient Age: 77 [facility] ROOM 3 Gender: Male Note Status: Finalized Procedure:            Upper GI endoscopy Indications:          Follow-up of Barrett's esophagus Providers:            Christena DeemMartin U. Rapheal Masso, MD Referring MD:         Clydie Braunavid Fitzgerald (Referring MD) Medicines:            Monitored Anesthesia Care Complications:        No immediate complications. Procedure:            Pre-Anesthesia Assessment:                       - ASA Grade Assessment: II - A patient with mild                        systemic disease.                       After obtaining informed consent, the endoscope was                        passed under direct vision. Throughout the procedure,                        the patient's blood pressure, pulse, and oxygen                        saturations were monitored continuously. The Endoscope                        was introduced through the mouth, and advanced to the                        third part of duodenum. The upper GI endoscopy was                        accomplished without difficulty. The patient tolerated                        the procedure well. Findings:      The Z-line was regular. Mucosa was biopsied with a cold forceps for       histology in 4 quadrants. One specimen bottle was sent to pathology.      A non-obstructing Schatzki ring (acquired) was found at the       gastroesophageal junction. Biopsies crossed the ring/junction.      A small hiatal hernia was present.      Diffuse mild inflammation characterized by erythema was found in the       gastric body. Biopsies were taken with a cold forceps for histology.      The cardia and gastric fundus were normal on retroflexion.      The examined duodenum was normal. Impression:           - Z-line regular.  Biopsied.                       - Non-obstructing Schatzki  ring.                       - Small hiatal hernia.                       - Gastritis. Biopsied.                       - Normal examined duodenum. Recommendation:       - Discharge patient to home.                       - Continue present medications.                       - Telephone GI clinic for pathology results in 1 week. Procedure Code(s):    --- Professional ---                       [phone], Esophagogastroduodenoscopy, flexible, transoral;                        with biopsy, single or multiple Diagnosis Code(s):    --- Professional ---                       K22.70, Barrett's esophagus without dysplasia                       K22.2, Esophageal obstruction                       K44.9, Diaphragmatic hernia without obstruction or                        gangrene                       K29.70, Gastritis, unspecified, without bleeding CPT copyright 2016 American Medical Association. All rights reserved. The codes documented in this report are preliminary and upon coder review may  be revised to meet current compliance requirements. Christena Deem, MD 09/23/2017 10:08:41 AM This report has been signed electronically. Number of Addenda: 0 Note Initiated On: 09/23/2017 9:41 AM      [facility]

## 2017-09-23 NOTE — Anesthesia Post-op Follow-up Note (Signed)
Anesthesia QCDR form completed.        

## 2017-09-23 NOTE — Transfer of Care (Signed)
Immediate Anesthesia Transfer of Care Note  Patient: Dale FieldGeorge P Holloway  Procedure(s) Performed: ESOPHAGOGASTRODUODENOSCOPY (EGD) WITH PROPOFOL (N/A )  Patient Location: PACU and Endoscopy Unit  Anesthesia Type:General  Level of Consciousness: drowsy  Airway & Oxygen Therapy: Patient Spontanous Breathing and Patient connected to nasal cannula oxygen  Post-op Assessment: Report given to RN and Post -op Vital signs reviewed and stable  Post vital signs: Reviewed and stable  Last Vitals:  Vitals:   09/23/17 0840 09/23/17 1000  BP: (!) 176/98 106/84  Pulse: 69 72  Resp: 17 13  Temp: (!) 36.3 C (!) 36.2 C  SpO2: 100% 96%    Last Pain: There were no vitals filed for this visit.       Complications: No apparent anesthesia complications

## 2017-09-23 NOTE — Anesthesia Preprocedure Evaluation (Signed)
Anesthesia Evaluation  Patient identified by MRN, date of birth, ID band Patient awake    Reviewed: Allergy & Precautions, NPO status , Patient's Chart, lab work & pertinent test results  History of Anesthesia Complications Negative for: history of anesthetic complications  Airway Mallampati: III  TM Distance: >3 FB Neck ROM: Full    Dental no notable dental hx.    Pulmonary neg sleep apnea, neg COPD, former smoker,    breath sounds clear to auscultation- rhonchi (-) wheezing      Cardiovascular Exercise Tolerance: Good hypertension, Pt. on medications (-) CAD, (-) Past MI, (-) Cardiac Stents and (-) CABG  Rhythm:Regular Rate:Normal - Systolic murmurs and - Diastolic murmurs    Neuro/Psych Anxiety negative neurological ROS     GI/Hepatic Neg liver ROS, hiatal hernia, GERD  ,  Endo/Other  negative endocrine ROSneg diabetes  Renal/GU negative Renal ROS     Musculoskeletal negative musculoskeletal ROS (+)   Abdominal (+) - obese,   Peds  Hematology negative hematology ROS (+)   Anesthesia Other Findings Past Medical History: No date: Anxiety No date: BPH (benign prostatic hyperplasia) No date: Constipation No date: Diverticulosis No date: Dupuytren's contracture No date: GERD (gastroesophageal reflux disease)     Comment:  Barrett's Esophagus No date: Glaucoma No date: Hemorrhoids No date: History of hiatal hernia No date: History of stress test     Comment:  Reported as Negative for Angina Pectoris No date: Hypertension No date: Rhinitis No date: Sinusitis   Reproductive/Obstetrics                             Anesthesia Physical Anesthesia Plan  ASA: II  Anesthesia Plan: General   Post-op Pain Management:    Induction: Intravenous  PONV Risk Score and Plan: 1 and Propofol infusion  Airway Management Planned: Natural Airway  Additional Equipment:   Intra-op Plan:    Post-operative Plan:   Informed Consent: I have reviewed the patients History and Physical, chart, labs and discussed the procedure including the risks, benefits and alternatives for the proposed anesthesia with the patient or authorized representative who has indicated his/her understanding and acceptance.   Dental advisory given  Plan Discussed with: CRNA and Anesthesiologist  Anesthesia Plan Comments:         Anesthesia Quick Evaluation

## 2017-09-24 ENCOUNTER — Encounter: Payer: Self-pay | Admitting: Gastroenterology

## 2017-09-25 LAB — SURGICAL PATHOLOGY

## 2024-07-14 ENCOUNTER — Ambulatory Visit: Admission: RE | Admit: 2024-07-14 | Payer: Self-pay | Admitting: Ophthalmology

## 2024-07-14 ENCOUNTER — Encounter: Admission: RE | Payer: Self-pay | Source: Home / Self Care

## 2024-07-14 SURGERY — PHACOEMULSIFICATION, CATARACT, WITH IOL INSERTION
Anesthesia: Topical | Laterality: Right
